# Patient Record
Sex: Female | Born: 1935 | Race: White | Hispanic: No | Marital: Single | Smoking: Former smoker
Health system: Southern US, Community
[De-identification: ages and names within clinical notes are randomized; demographics above are authoritative.]

## PROBLEM LIST (undated history)

## (undated) DIAGNOSIS — C9110 Chronic lymphocytic leukemia of B-cell type not having achieved remission: Secondary | ICD-10-CM

---

## 2013-06-11 ENCOUNTER — Encounter (HOSPITAL_BASED_OUTPATIENT_CLINIC_OR_DEPARTMENT_OTHER): Payer: Self-pay | Admitting: Emergency Medicine

## 2013-06-11 ENCOUNTER — Emergency Department (HOSPITAL_BASED_OUTPATIENT_CLINIC_OR_DEPARTMENT_OTHER)
Admission: EM | Admit: 2013-06-11 | Discharge: 2013-06-11 | Disposition: A | Discharge status: Home / Self Care | Payer: Medicare Other | Attending: Emergency Medicine | Admitting: Emergency Medicine

## 2013-06-11 ENCOUNTER — Emergency Department (HOSPITAL_BASED_OUTPATIENT_CLINIC_OR_DEPARTMENT_OTHER): Payer: Medicare Other

## 2013-06-11 DIAGNOSIS — Y9301 Activity, walking, marching and hiking: Secondary | ICD-10-CM | POA: Insufficient documentation

## 2013-06-11 DIAGNOSIS — W010XXA Fall on same level from slipping, tripping and stumbling without subsequent striking against object, initial encounter: Secondary | ICD-10-CM | POA: Insufficient documentation

## 2013-06-11 DIAGNOSIS — Z79899 Other long term (current) drug therapy: Secondary | ICD-10-CM | POA: Insufficient documentation

## 2013-06-11 DIAGNOSIS — Z862 Personal history of diseases of the blood and blood-forming organs and certain disorders involving the immune mechanism: Secondary | ICD-10-CM | POA: Insufficient documentation

## 2013-06-11 DIAGNOSIS — S20219A Contusion of unspecified front wall of thorax, initial encounter: Secondary | ICD-10-CM | POA: Insufficient documentation

## 2013-06-11 DIAGNOSIS — Y9289 Other specified places as the place of occurrence of the external cause: Secondary | ICD-10-CM | POA: Insufficient documentation

## 2013-06-11 DIAGNOSIS — Z87891 Personal history of nicotine dependence: Secondary | ICD-10-CM | POA: Insufficient documentation

## 2013-06-11 HISTORY — DX: Chronic lymphocytic leukemia of B-cell type not having achieved remission: C91.10

## 2013-06-11 MED ORDER — HYDROCODONE-ACETAMINOPHEN 5-325 MG PO TABS: 1.0000 | ORAL_TABLET | ORAL | Status: AC | PRN

## 2013-06-11 NOTE — ED Notes (Signed)
Pt complains of right chest pain after falling.  Pt reports it "feels tight" when I take a deep breath or move.  Denies LOC.  Reports hurts to lay in bed.

## 2013-06-11 NOTE — ED Notes (Signed)
Tripped and [address] Wednesday, Landed on her right chest.  Denies striking head.  Denies SHOB, cough.  Chest pain associated with breathing.

## 2013-06-11 NOTE — Discharge Instructions (Signed)
Chest Contusion °A chest contusion is a deep bruise on your chest area. Contusions are the result of an injury that caused bleeding under the skin. A chest contusion may involve bruising of the skin, muscles, or ribs. The contusion may turn blue, purple, or yellow. Minor injuries will give you a painless contusion, but more severe contusions may stay painful and swollen for a few weeks. °CAUSES  °A contusion is usually caused by a blow, trauma, or direct force to an area of the body. °SYMPTOMS  °· Swelling and redness of the injured area. °· Discoloration of the injured area. °· Tenderness and soreness of the injured area. °· Pain. °DIAGNOSIS  °The diagnosis can be made by taking a history and performing a physical exam. An X-ray, CT scan, or MRI may be needed to determine if there were any associated injuries, such as broken bones (fractures) or internal injuries. °TREATMENT  °Often, the best treatment for a chest contusion is resting, icing, and applying cold compresses to the injured area. Deep breathing exercises may be recommended to reduce the risk of pneumonia. Over-the-counter medicines may also be recommended for pain control. °HOME CARE INSTRUCTIONS  °· Put ice on the injured area. °· Put ice in a plastic bag. °· Place a towel between your skin and the bag. °· Leave the ice on for 15-20 minutes, 03-04 times a day. °· Only take over-the-counter or prescription medicines as directed by your caregiver. Your caregiver may recommend avoiding anti-inflammatory medicines (aspirin, ibuprofen, and naproxen) for 48 hours because these medicines may increase bruising. °· Rest the injured area. °· Perform deep-breathing exercises as directed by your caregiver. °· Stop smoking if you smoke. °· Do not lift objects over 5 pounds (2.3 kg) for 3 days or longer if recommended by your caregiver. °SEEK IMMEDIATE MEDICAL CARE IF:  °· You have increased bruising or swelling. °· You have pain that is getting worse. °· You have  difficulty breathing. °· You have dizziness, weakness, or fainting. °· You have blood in your urine or stool. °· You cough up or vomit blood. °· Your swelling or pain is not relieved with medicines. °MAKE SURE YOU:  °· Understand these instructions. °· Will watch your condition. °· Will get help right away if you are not doing well or get worse. °Document Released: 10/28/2000 Document Revised: 10/28/2011 Document Reviewed: 07/27/2011 °[employer]® Patient Information ©2014 [employer]. ° °

## 2013-06-11 NOTE — ED Provider Notes (Signed)
CSN: [id number]     Arrival date & time 06/11/13  1730 History  This chart was scribed for Malvin Johns, MD by Rolanda Lundborg, ED Scribe. This patient was seen in room MH01/MH01 and the patient's care was started at 6:02 PM.    Chief Complaint  Patient presents with  . Fall   The history is provided by the patient. No language interpreter was used.   HPI Comments: Stacy Russo is a 79 y.o. female who presents to the Emergency Department complaining of tripping and falling 4 days ago while walking outside. Pt states she landed on her right ribs and right arm and is now having gradual-onset, gradually-worsening pain in the right ribs. Movement and breathing makes the pain in the chest worse. States she also fell a week before when she tripped over a brick outside but denies injury. She denies feeling dizzy or weak before the fall. She denies hitting her head or LOC. She denies head or neck pain. She is not on any blood thinners. She is UTD on her tetanus vaccine.    Past Medical History  Diagnosis Date  . CLL (chronic lymphocytic leukemia)     Remission Status   History reviewed. No pertinent past surgical history. No family history on file. History  Substance Use Topics  . Smoking status: Former Research scientist (life sciences)  . Smokeless tobacco: Not on file  . Alcohol Use: Yes   OB History   Grav Para Term Preterm Abortions TAB SAB Ect Mult Living                 Review of Systems  Constitutional: Negative for fever, chills, diaphoresis and fatigue.  HENT: Negative for congestion, rhinorrhea and sneezing.   Eyes: Negative.   Respiratory: Negative for cough, chest tightness and shortness of breath.   Cardiovascular: Negative for chest pain and leg swelling.  Gastrointestinal: Negative for nausea, vomiting, abdominal pain, diarrhea and blood in stool.  Genitourinary: Negative for frequency, hematuria, flank pain and difficulty urinating.  Musculoskeletal: Negative for arthralgias and back pain.  Skin:  Negative for rash.  Neurological: Negative for dizziness, speech difficulty, weakness, numbness and headaches.      Allergies  Ambien and Elavil  Home Medications   Prior to Admission medications   Medication Sig Start Date End Date Taking? Authorizing Provider  loratadine (CLARITIN) 10 MG tablet Take 10 mg by mouth daily.   Yes Historical Provider, MD  LORazepam (ATIVAN) 1 MG tablet Take 0.5 mg by mouth every 8 (eight) hours as needed for anxiety.   Yes Historical Provider, MD  losartan (COZAAR) 25 MG tablet Take 25 mg by mouth daily.   Yes Historical Provider, MD  montelukast (SINGULAIR) 10 MG tablet Take 10 mg by mouth at bedtime.   Yes Historical Provider, MD   BP 179/91  Pulse 84  Temp(Src) 97.7 F (36.5 C) (Oral)  Resp 24  Ht 5\' 2"  (1.575 m)  Wt 130 lb (58.968 kg)  BMI 23.77 kg/m2  SpO2 99% Physical Exam  Constitutional: She is oriented to person, place, and time. She appears well-developed and well-nourished.  HENT:  Head: Normocephalic and atraumatic.  Eyes: Pupils are equal, round, and reactive to light.  Neck: Normal range of motion. Neck supple.  No pain along the spine  Cardiovascular: Normal rate, regular rhythm and normal heart sounds.   Pulmonary/Chest: Effort normal and breath sounds normal. No respiratory distress. She has no wheezes. She has no rales. She exhibits no tenderness.  Abdominal: Soft. Bowel  sounds are normal. There is no tenderness. There is no rebound and no guarding.  Musculoskeletal: Normal range of motion. She exhibits no edema.  TTP of the right ribs at the mid axillary line. No crepitus or deformity no bruising or other wounds.  Healing scabs to the left elbow but no underlying bony tenderness. No pain on palpation or ROM of the extremities.   Lymphadenopathy:    She has no cervical adenopathy.  Neurological: She is alert and oriented to person, place, and time.  Skin: Skin is warm and dry. No rash noted.  Psychiatric: She has a normal  mood and affect.    ED Course  Procedures (including critical care time) Medications - No data to display  DIAGNOSTIC STUDIES: Oxygen Saturation is 99% on RA, normal by my interpretation.    COORDINATION OF CARE: 6:23 PM- Discussed treatment plan with pt. Pt agrees to plan.    Labs Review Labs Reviewed - No data to display  Imaging Review Dg Chest 2 View  06/11/2013   CLINICAL DATA:  Fall on Wednesday. Right-sided chest pain and rib pain.  EXAM: CHEST  2 VIEW  COMPARISON:  None.  FINDINGS: The marker is present over the inferior right lateral ribs. There is no displaced rib fracture. No pneumothorax. Cardiopericardial silhouette and mediastinal contours appear within normal limits. Truncation of the distal right clavicle is present, presumably postoperative. Posttraumatic osteolysis could also produce this appearance. Thoracic spondylosis is present with mild mid thoracic compression fracture. This is age indeterminate.  IMPRESSION: No displaced rib fracture or acute cardiopulmonary disease.   Electronically Signed   By: Dereck Ligas M.D.   On: 06/11/2013 18:19     EKG Interpretation None      MDM   Final diagnoses:  Rib contusion   Patient is well-appearing. She has no hypoxia or shortness of breath. She has reproducible tenderness to the right mid ribs. There is no rash or evidence of zoster. She has new rib fractures identified. There is no pneumothorax. She was discharged with a prescription for hydrocodone and an incentive spirometer. She was advised to return or followup with her primary care physician if her symptoms continue or are worsening.  I personally performed the services described in this documentation, which was scribed in my presence.  The recorded information has been reviewed and considered.   Malvin Johns, MD 06/11/13 2225

## 2015-08-04 IMAGING — CR DG CHEST 2V
2 series · 2 of 2 positions shown · non-contrast
Comparison: None.

CLINICAL DATA: Fall on [REDACTED]. Right-sided chest pain and rib
pain.

EXAM:
CHEST  2 VIEW

[w chest pa]
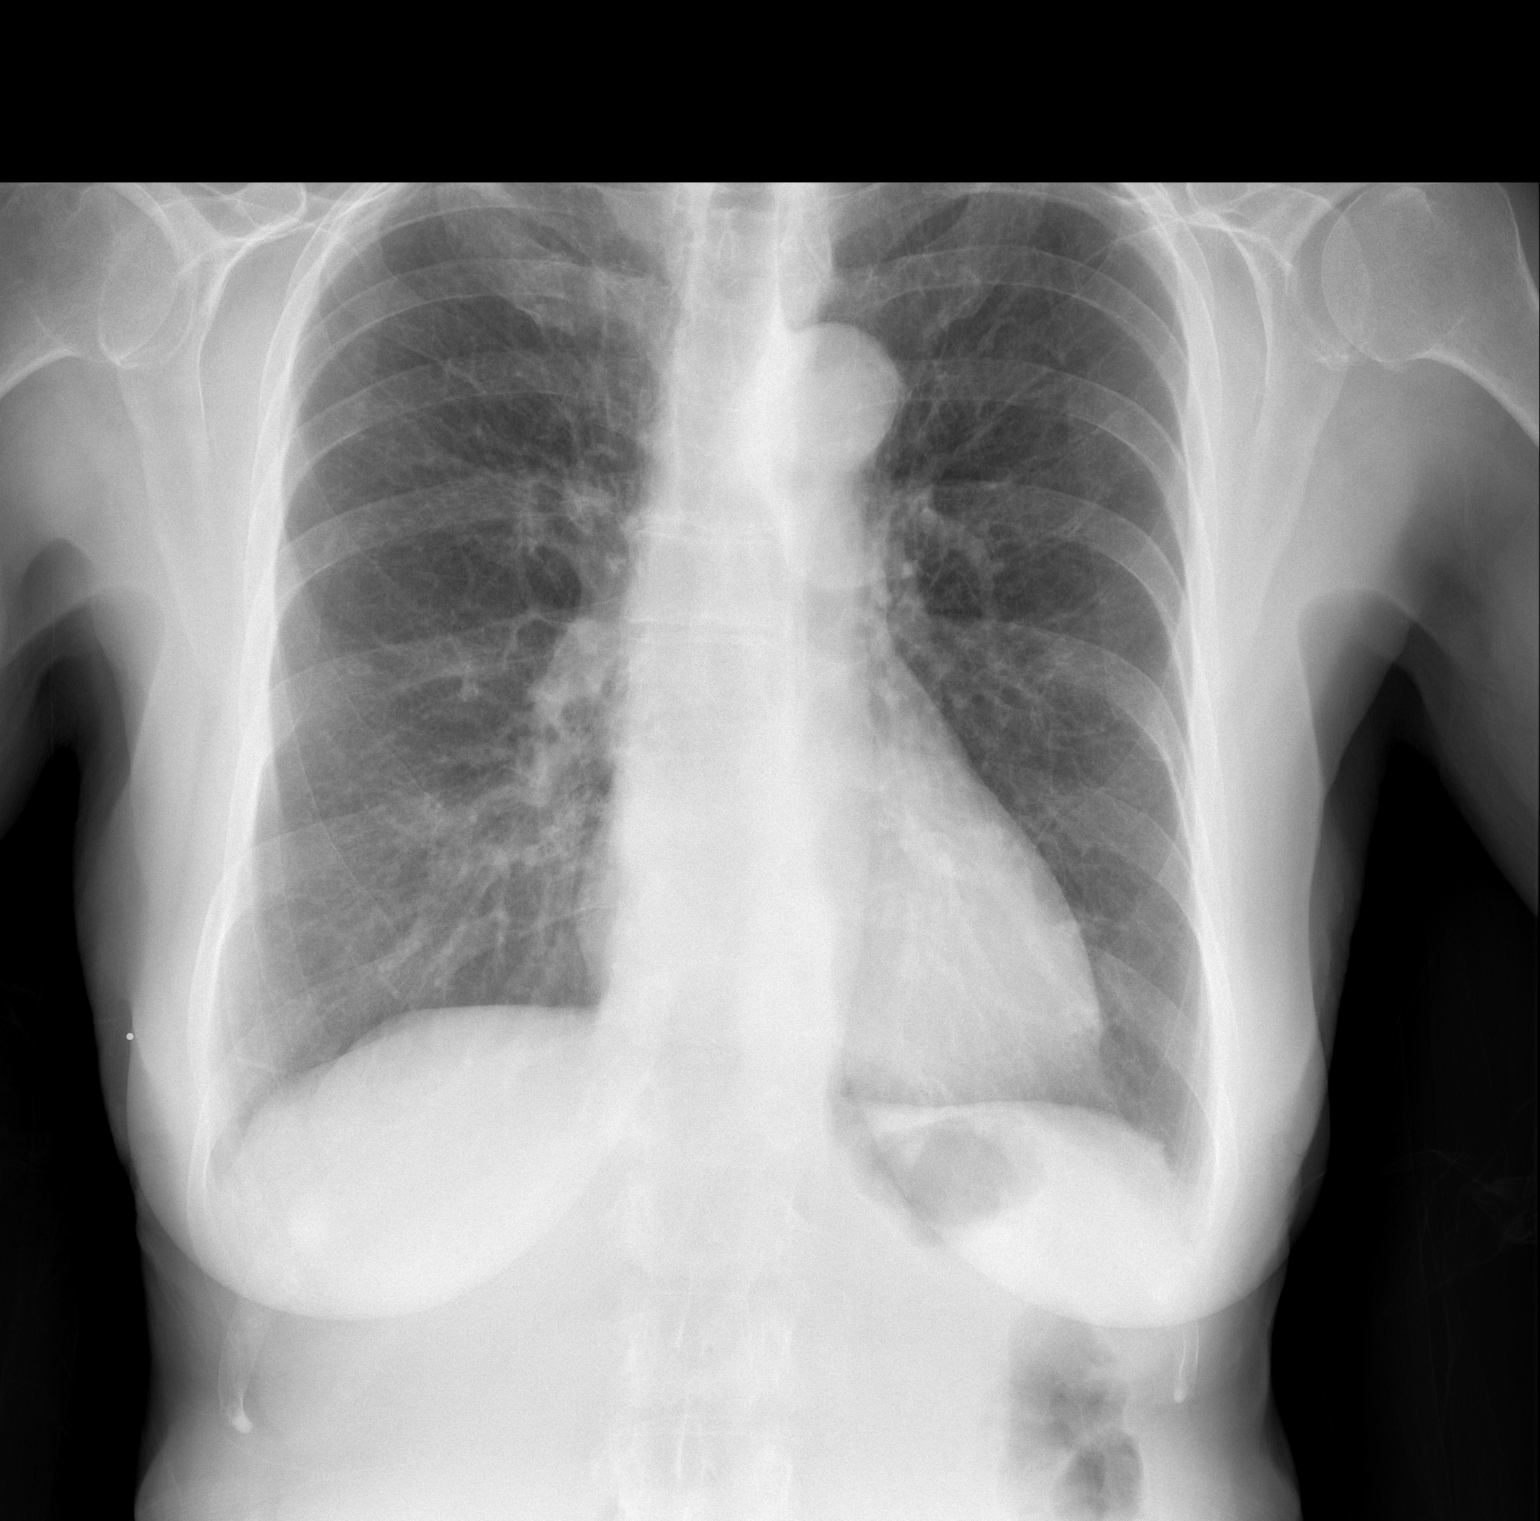

[w chest lat]
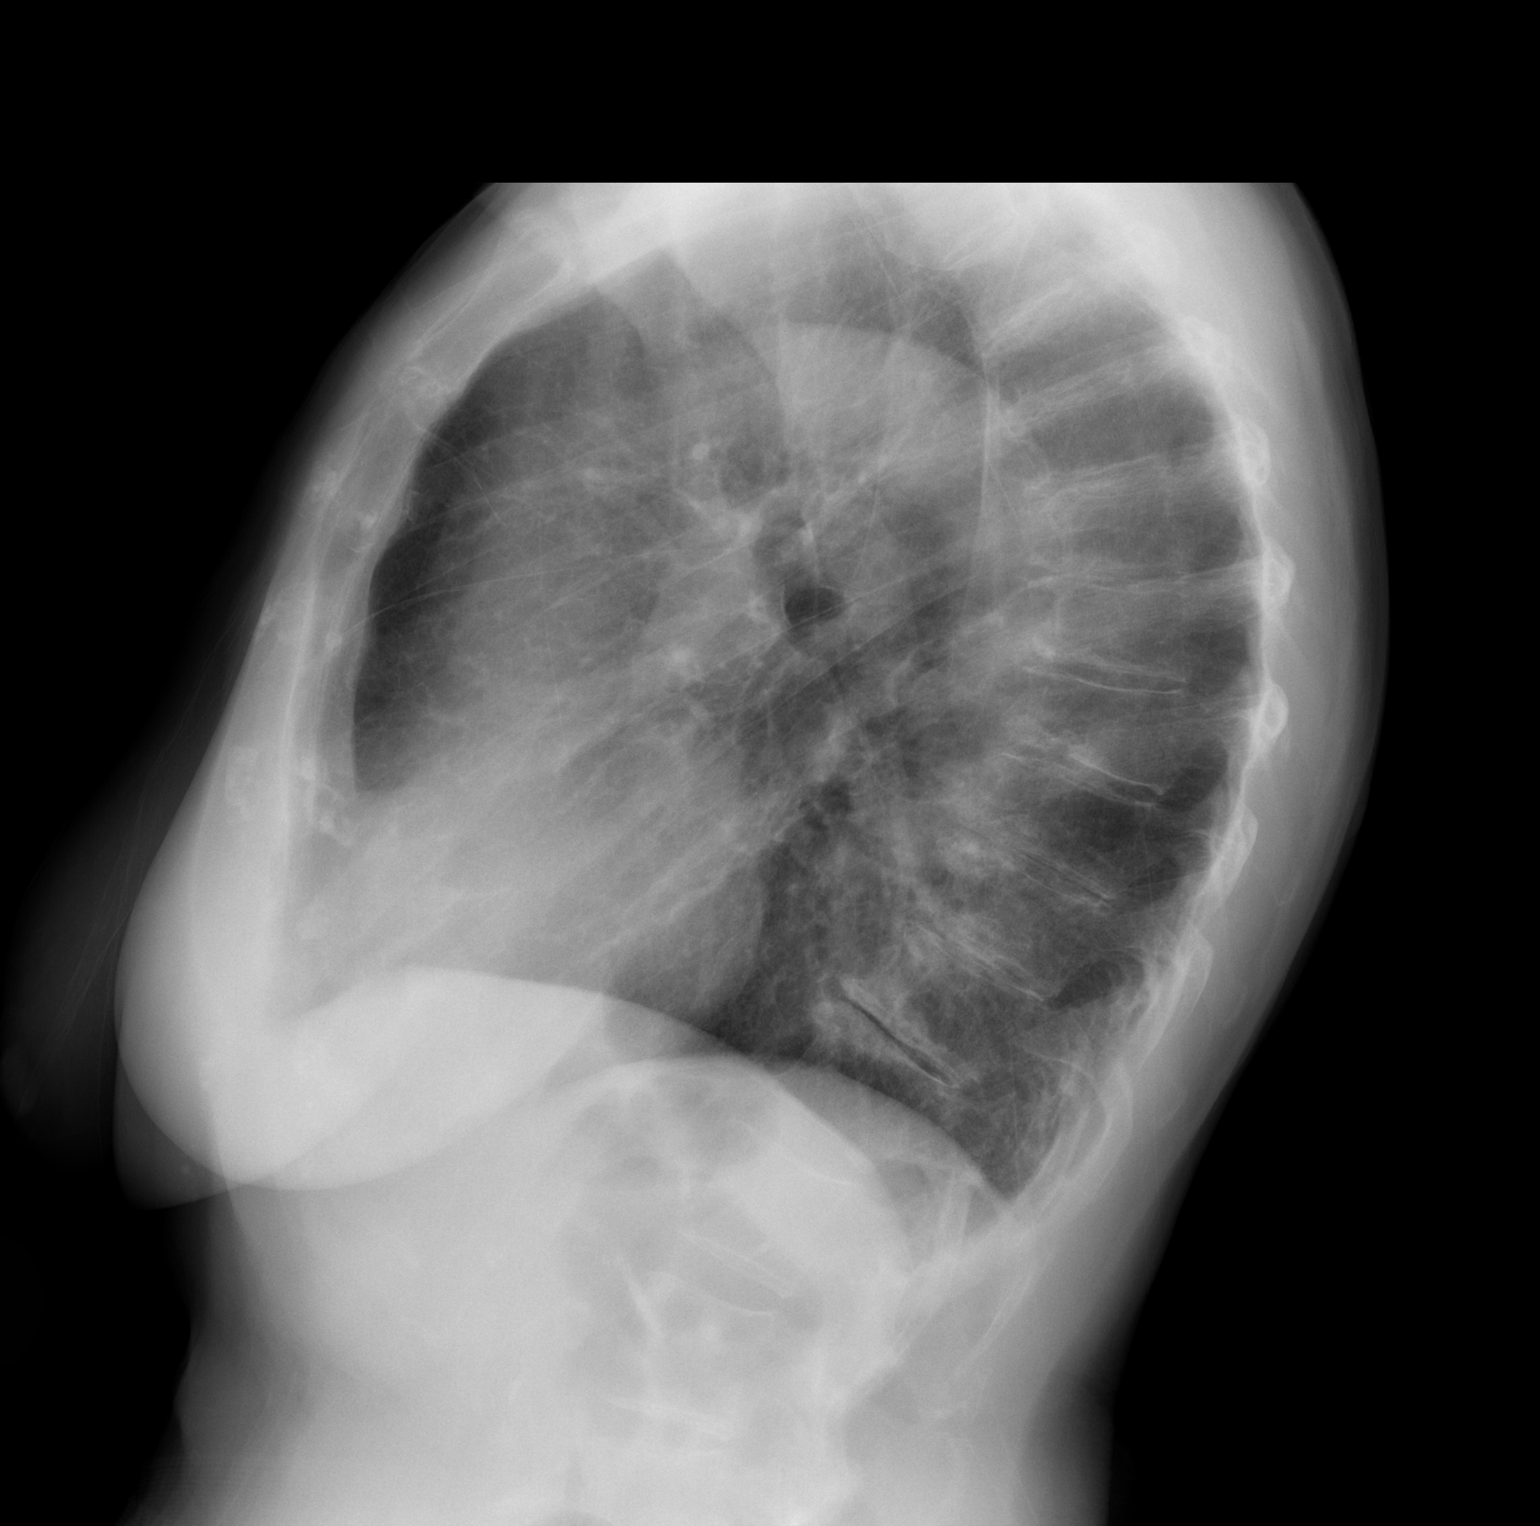

[2 of 2 positions shown; findings below may reference images not displayed]

FINDINGS: The marker is present over the inferior right lateral ribs. There is
no displaced rib fracture. No pneumothorax. Cardiopericardial
silhouette and mediastinal contours appear within normal limits.
Truncation of the distal right clavicle is present, presumably
postoperative. Posttraumatic osteolysis could also produce this
appearance. Thoracic spondylosis is present with mild mid thoracic
compression fracture. This is age indeterminate.
IMPRESSION: No displaced rib fracture or acute cardiopulmonary disease.
